# Patient Record
Sex: Male | Born: 1975
Health system: Southern US, Community
[De-identification: ages and names within clinical notes are randomized; demographics above are authoritative.]

## PROBLEM LIST (undated history)

## (undated) DIAGNOSIS — N200 Calculus of kidney: Secondary | ICD-10-CM

## (undated) DIAGNOSIS — I10 Essential (primary) hypertension: Secondary | ICD-10-CM

## (undated) DIAGNOSIS — B351 Tinea unguium: Secondary | ICD-10-CM

## (undated) DIAGNOSIS — F32A Depression, unspecified: Secondary | ICD-10-CM

## (undated) DIAGNOSIS — F329 Major depressive disorder, single episode, unspecified: Secondary | ICD-10-CM

## (undated) HISTORY — PX: LITHOTRIPSY: SUR834

## (undated) HISTORY — PX: HERNIA REPAIR: SHX51

## (undated) HISTORY — DX: Tinea unguium: B35.1

## (undated) HISTORY — DX: Depression, unspecified: F32.A

## (undated) HISTORY — DX: Major depressive disorder, single episode, unspecified: F32.9

## (undated) HISTORY — DX: Essential (primary) hypertension: I10

## (undated) HISTORY — DX: Calculus of kidney: N20.0

---

## 2004-04-13 ENCOUNTER — Emergency Department (HOSPITAL_COMMUNITY): Admission: EM | Admit: 2004-04-13 | Discharge: 2004-04-13 | Payer: Self-pay | Admitting: Family Medicine

## 2005-10-28 ENCOUNTER — Emergency Department (HOSPITAL_COMMUNITY): Admission: EM | Admit: 2005-10-28 | Discharge: 2005-10-28 | Payer: Self-pay | Admitting: Family Medicine

## 2006-10-07 ENCOUNTER — Emergency Department (HOSPITAL_COMMUNITY): Admission: EM | Admit: 2006-10-07 | Discharge: 2006-10-07 | Payer: Self-pay | Admitting: Family Medicine

## 2007-11-23 ENCOUNTER — Ambulatory Visit (HOSPITAL_BASED_OUTPATIENT_CLINIC_OR_DEPARTMENT_OTHER): Admission: RE | Admit: 2007-11-23 | Discharge: 2007-11-23 | Payer: Self-pay | Admitting: Urology

## 2007-11-23 ENCOUNTER — Encounter (INDEPENDENT_AMBULATORY_CARE_PROVIDER_SITE_OTHER): Payer: Self-pay | Admitting: Urology

## 2007-12-18 ENCOUNTER — Emergency Department (HOSPITAL_COMMUNITY): Admission: EM | Admit: 2007-12-18 | Discharge: 2007-12-18 | Payer: Self-pay | Admitting: Family Medicine

## 2008-11-17 ENCOUNTER — Ambulatory Visit: Payer: Self-pay | Admitting: Internal Medicine

## 2008-11-17 DIAGNOSIS — F329 Major depressive disorder, single episode, unspecified: Secondary | ICD-10-CM | POA: Insufficient documentation

## 2008-11-17 DIAGNOSIS — L74519 Primary focal hyperhidrosis, unspecified: Secondary | ICD-10-CM | POA: Insufficient documentation

## 2008-11-17 DIAGNOSIS — K648 Other hemorrhoids: Secondary | ICD-10-CM | POA: Insufficient documentation

## 2008-11-17 DIAGNOSIS — K921 Melena: Secondary | ICD-10-CM | POA: Insufficient documentation

## 2008-11-17 LAB — CONVERTED CEMR LAB
ALT: 28 units/L (ref 0–53)
Albumin: 4.5 g/dL (ref 3.5–5.2)
BUN: 12 mg/dL (ref 6–23)
Basophils Relative: 0 % (ref 0.0–3.0)
Chloride: 104 meq/L (ref 96–112)
Cholesterol: 193 mg/dL (ref 0–200)
Creatinine, Ser: 1.5 mg/dL (ref 0.4–1.5)
Eosinophils Relative: 5 % (ref 0.0–5.0)
Glucose, Bld: 93 mg/dL (ref 70–99)
Hgb A1c MFr Bld: 5.9 % (ref 4.6–6.5)
LDL Cholesterol: 136 mg/dL — ABNORMAL HIGH (ref 0–99)
Lymphocytes Relative: 28 % (ref 12.0–46.0)
Monocytes Relative: 2.9 % — ABNORMAL LOW (ref 3.0–12.0)
Neutrophils Relative %: 64.1 % (ref 43.0–77.0)
RBC: 5.73 M/uL (ref 4.22–5.81)
TSH: 1.66 microintl units/mL (ref 0.35–5.50)
Total Bilirubin: 1.3 mg/dL — ABNORMAL HIGH (ref 0.3–1.2)
Total Protein: 7.5 g/dL (ref 6.0–8.3)
Triglycerides: 71 mg/dL (ref 0.0–149.0)
WBC: 5.9 10*3/uL (ref 4.5–10.5)

## 2008-12-09 ENCOUNTER — Telehealth: Payer: Self-pay | Admitting: Internal Medicine

## 2009-12-29 ENCOUNTER — Emergency Department (HOSPITAL_COMMUNITY): Admission: EM | Admit: 2009-12-29 | Discharge: 2009-12-29 | Payer: Self-pay | Admitting: Emergency Medicine

## 2010-12-07 NOTE — Op Note (Signed)
Seth Gallagher, DULWORTH               ACCOUNT NO.:  [id number]   MEDICAL RECORD NO.:  [id number]          PATIENT TYPE:  AMB   LOCATION:  [facility]                         FACILITY:  [facility]   PHYSICIAN:  Ronald L. Earlene Plater, M.D.  DATE OF BIRTH:  September 05, 1975   DATE OF PROCEDURE:  11/23/2007  DATE OF DISCHARGE:                               OPERATIVE REPORT   PREOPERATIVE DIAGNOSIS:  Probable interstitial cystitis and questionable  dysfunctional voiding.   POSTOPERATIVE DIAGNOSIS:  Probable interstitial cystitis and  questionable dysfunctional voiding.   OPERATIVE PROCEDURE:  Cystourethroscopy, HOD, and bladder biopsy.   SURGEON:  Lucrezia Starch. Earlene Plater, M.D.   ANESTHESIA:  LMA.   BLOOD LOSS:  Negligible.   TUBES:  None.   COMPLICATIONS:  None.   SPECIMEN:  Sent to pathology.   INDICATIONS FOR PROCEDURE:  [name] is a very nice 35 year old white  male who presents with a long history of urgency, frequency, dysuria and  suprapubic pain __________ by voiding.  Office cystoscopy revealed mild  diffuse inflammation.  No other lesions were present.  After  understanding the risks, benefits and alternatives, he elected to  undergo cystourethroscopy, hydraulic bladder distention, and bladder  biopsy for both therapeutic and diagnostic reasons.   PROCEDURE IN DETAIL:  The patient was placed in the supine position.  After proper LMA anesthesia, he was placed in the dorsal lithotomy  position and prepped and draped with Betadine in a sterile fashion.  Cystourethroscopy was performed with a 22.5-French Olympus panendoscope.  Utilizing the 12 and 70-degree lens, the bladder was carefully  inspected.  It did not appear to be obstructed, and no significant  prostatic enlargement was noted.  He underwent hydraulic bladder  distention and had a volume of 600 mL, a pressure of 80 cm of water.  On  release, there were some submucosal glomerulations and a few bleeding  spots, especially posteriorly, and  there were also thickened  trabeculations noted after the distention, consistent with dysfunctional  voiding.  The cold cup biopsy was performed in the posteriorly midline  and submitted to pathology, and the base was cauterized with Bugbee  coagulation cautery.  No other lesions were noted to be present.  The  bladder was drained.  The panendoscope was removed.   The patient was taken to the recovery room stable.      Ronald L. Earlene Plater, M.D.  Electronically Signed     RLD/MEDQ  D:  11/23/2007  T:  11/23/2007  Job:  [id number]

## 2011-02-22 ENCOUNTER — Ambulatory Visit (INDEPENDENT_AMBULATORY_CARE_PROVIDER_SITE_OTHER): Payer: BC Managed Care – PPO | Admitting: Family Medicine

## 2011-02-22 ENCOUNTER — Encounter: Payer: Self-pay | Admitting: Family Medicine

## 2011-02-22 VITALS — BP 130/70 | HR 82 | Temp 99.0°F | Ht 75.0 in | Wt 253.0 lb

## 2011-02-22 DIAGNOSIS — K648 Other hemorrhoids: Secondary | ICD-10-CM

## 2011-02-22 DIAGNOSIS — Z833 Family history of diabetes mellitus: Secondary | ICD-10-CM

## 2011-02-22 DIAGNOSIS — Z23 Encounter for immunization: Secondary | ICD-10-CM

## 2011-02-22 DIAGNOSIS — F329 Major depressive disorder, single episode, unspecified: Secondary | ICD-10-CM

## 2011-02-22 DIAGNOSIS — F3289 Other specified depressive episodes: Secondary | ICD-10-CM

## 2011-02-22 NOTE — Patient Instructions (Signed)
Glad to see you today. I would get a flu shot each fall.   Keep exercising. Call me if the hemorrhoids get worse.  Try to avoid straining on the toilet in the meantime.  Come back for fasting labs.  You can get your results through our phone system.  Follow the instructions on the blue card.

## 2011-02-22 NOTE — Progress Notes (Signed)
New pt to ext care. Requesting records.    H/o blood in stool, with prev hemorrhoids.  Mild intermittent sx.  Asking to have this rechecked.    H/o onychomycosis, per [facility] derm  Had Uro eval, requesting records.  FH HTN/DM.  Return for labs.   Due for tdap.   Feeling well o/w.    H/o depression after his father died, but is doing well now.  PMH and SH reviewed  ROS: See HPI, otherwise noncontributory.  Meds, vitals, and allergies reviewed.   GEN: nad, alert and oriented HEENT: mucous membranes moist NECK: supple w/o LA CV: rrr.  no murmur PULM: ctab, no inc wob ABD: soft, +bs EXT: no edema SKIN: no acute rash Rectal exam w/o gross blood, ext exam wnl except for old skin tag remnant of prev ext hemorrhoid

## 2011-02-23 ENCOUNTER — Encounter: Payer: Self-pay | Admitting: Family Medicine

## 2011-02-23 DIAGNOSIS — Z833 Family history of diabetes mellitus: Secondary | ICD-10-CM | POA: Insufficient documentation

## 2011-02-23 NOTE — Assessment & Plan Note (Signed)
Reassured, d/w pt about about not straining and using stool softener as needed.

## 2011-02-23 NOTE — Assessment & Plan Note (Signed)
H/o, related to father's death.  Doing well.  He is exercising, no using drugs, and his mood is good.

## 2011-02-23 NOTE — Assessment & Plan Note (Signed)
Return for labs.  Continue healthy diet/exercise.

## 2011-03-02 ENCOUNTER — Other Ambulatory Visit (INDEPENDENT_AMBULATORY_CARE_PROVIDER_SITE_OTHER): Payer: BC Managed Care – PPO | Admitting: Family Medicine

## 2011-03-02 DIAGNOSIS — Z833 Family history of diabetes mellitus: Secondary | ICD-10-CM

## 2011-03-02 LAB — BASIC METABOLIC PANEL
BUN: 16 mg/dL (ref 6–23)
CO2: 29 mEq/L (ref 19–32)
Calcium: 9.3 mg/dL (ref 8.4–10.5)
GFR: 81.94 mL/min (ref >60.00)
Glucose, Bld: 89 mg/dL (ref 70–99)

## 2011-03-02 LAB — LIPID PANEL
Cholesterol: 160 mg/dL (ref 0–200)
HDL: 44 mg/dL (ref >39.00)
VLDL: 16.6 mg/dL (ref 0.0–40.0)

## 2011-03-03 ENCOUNTER — Encounter: Payer: Self-pay | Admitting: Family Medicine

## 2011-03-03 DIAGNOSIS — N411 Chronic prostatitis: Secondary | ICD-10-CM | POA: Insufficient documentation

## 2011-05-12 ENCOUNTER — Encounter: Payer: Self-pay | Admitting: Family Medicine

## 2011-11-17 ENCOUNTER — Encounter: Payer: BC Managed Care – PPO | Admitting: Family Medicine

## 2011-11-30 ENCOUNTER — Encounter: Payer: Self-pay | Admitting: Family Medicine

## 2011-11-30 ENCOUNTER — Ambulatory Visit (INDEPENDENT_AMBULATORY_CARE_PROVIDER_SITE_OTHER): Payer: BC Managed Care – PPO | Admitting: Family Medicine

## 2011-11-30 VITALS — BP 132/86 | HR 72 | Temp 98.6°F | Ht 75.0 in | Wt 254.8 lb

## 2011-11-30 DIAGNOSIS — R0789 Other chest pain: Secondary | ICD-10-CM

## 2011-11-30 DIAGNOSIS — Z202 Contact with and (suspected) exposure to infections with a predominantly sexual mode of transmission: Secondary | ICD-10-CM

## 2011-11-30 DIAGNOSIS — Z833 Family history of diabetes mellitus: Secondary | ICD-10-CM

## 2011-11-30 DIAGNOSIS — Z0000 Encounter for general adult medical examination without abnormal findings: Secondary | ICD-10-CM

## 2011-11-30 NOTE — Patient Instructions (Addendum)
I would get a flu shot each fall.   Keep exericising.   Come back for fasting labs.   Take care.

## 2011-11-30 NOTE — Progress Notes (Signed)
CPE- See plan.  Routine anticipatory guidance given to patient.  See health maintenance. Healthy diet, exercising.  Td 2012.  Flu shot discussed.  Due for one this fall.   PSA not indicated now.  D/w pt.  Colon cancer screening not needed.  H/o internal hemorrhoids, not bothersome except for some itching and occ blood with wiping.  Asking for routine STD testing.  No symptoms.   FH DM.   Will return for fasting labs.  No personal hx DM.    Atypical chest pain at rest.  Not with exercise.  L chest pain, lasts ~15 min.  No CP at time of exam.  Can exercise w/o pain.  No CAD hx.    PMH and SH reviewed  Meds, vitals, and allergies reviewed.   ROS: See HPI.  Otherwise negative.    GEN: nad, alert and oriented HEENT: mucous membranes moist NECK: supple w/o LA CV: rrr. PULM: ctab, no inc wob ABD: soft, +bs EXT: no edema SKIN: no acute rash  EKG unremarkable.

## 2011-12-01 DIAGNOSIS — Z0000 Encounter for general adult medical examination without abnormal findings: Secondary | ICD-10-CM | POA: Insufficient documentation

## 2011-12-01 DIAGNOSIS — R0789 Other chest pain: Secondary | ICD-10-CM | POA: Insufficient documentation

## 2011-12-01 NOTE — Assessment & Plan Note (Signed)
Routine anticipatory guidance given to patient. See health maintenance.  Healthy diet, exercising.  Td 2012.  Flu shot discussed. Due for one this fall.  PSA not indicated now. D/w pt.  Colon cancer screening not needed.  Asking for routine STD testing. Return for labs.

## 2011-12-01 NOTE — Assessment & Plan Note (Signed)
EKG, exercise history reassuring.  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  F/u prn.  D/w pt. Reassuring exam.

## 2011-12-01 NOTE — Assessment & Plan Note (Signed)
Return for labs.  Continue healthy diet.

## 2011-12-06 ENCOUNTER — Telehealth: Payer: Self-pay | Admitting: Radiology

## 2011-12-06 ENCOUNTER — Other Ambulatory Visit (INDEPENDENT_AMBULATORY_CARE_PROVIDER_SITE_OTHER): Payer: BC Managed Care – PPO

## 2011-12-06 DIAGNOSIS — Z833 Family history of diabetes mellitus: Secondary | ICD-10-CM

## 2011-12-06 DIAGNOSIS — Z202 Contact with and (suspected) exposure to infections with a predominantly sexual mode of transmission: Secondary | ICD-10-CM

## 2011-12-06 LAB — BASIC METABOLIC PANEL
BUN: 13 mg/dL (ref 6–23)
CO2: 29 mEq/L (ref 19–32)
Chloride: 102 mEq/L (ref 96–112)
Glucose, Bld: 88 mg/dL (ref 70–99)
Potassium: 4 mEq/L (ref 3.5–5.1)
Sodium: 139 mEq/L (ref 135–145)

## 2011-12-06 LAB — LIPID PANEL: Cholesterol: 167 mg/dL (ref 0–200)

## 2011-12-06 NOTE — Telephone Encounter (Signed)
Patients wants any test that you can do for cancer screening, family history of cancer.

## 2011-12-07 LAB — HIV ANTIBODY (ROUTINE TESTING W REFLEX): HIV: NONREACTIVE

## 2011-12-07 LAB — RPR

## 2011-12-07 NOTE — Telephone Encounter (Signed)
Please call pt.  You usually don't start colon cancer screening until 50 or 10 years ahead of family history, which ever is younger.  For example, if he had an uncle/parent/family member with colon cancer at 20, then we'd start at 44.  O/w would start at 50.  Thanks.

## 2011-12-07 NOTE — Telephone Encounter (Signed)
Left message on machine for patient to call back.

## 2011-12-09 NOTE — Telephone Encounter (Signed)
Left detailed message on VM.

## 2011-12-13 ENCOUNTER — Encounter: Payer: Self-pay | Admitting: *Deleted

## 2012-05-18 ENCOUNTER — Encounter: Payer: Self-pay | Admitting: Family Medicine

## 2012-05-18 ENCOUNTER — Ambulatory Visit (INDEPENDENT_AMBULATORY_CARE_PROVIDER_SITE_OTHER): Payer: BC Managed Care – PPO | Admitting: Family Medicine

## 2012-05-18 VITALS — BP 138/86 | HR 94 | Temp 98.2°F | Wt 252.0 lb

## 2012-05-18 DIAGNOSIS — N200 Calculus of kidney: Secondary | ICD-10-CM

## 2012-05-18 LAB — BASIC METABOLIC PANEL
CO2: 27 mEq/L (ref 19–32)
Calcium: 9.3 mg/dL (ref 8.4–10.5)
Creatinine, Ser: 1.6 mg/dL — ABNORMAL HIGH (ref 0.4–1.5)
Glucose, Bld: 113 mg/dL — ABNORMAL HIGH (ref 70–99)

## 2012-05-18 NOTE — Patient Instructions (Addendum)
We'll contact you with your lab report. Take the pain medicine as needed, finish the antibiotics and pull the stent tomorrow.  If you have concerns, call [phone] and tell them you had seen Dr. Brunilda Payor.   Take care.

## 2012-05-19 ENCOUNTER — Emergency Department (HOSPITAL_COMMUNITY)
Admission: EM | Admit: 2012-05-19 | Discharge: 2012-05-20 | Disposition: A | Discharge status: Home / Self Care | Payer: BC Managed Care – PPO | Attending: Emergency Medicine | Admitting: Emergency Medicine

## 2012-05-19 ENCOUNTER — Emergency Department (HOSPITAL_COMMUNITY): Payer: BC Managed Care – PPO

## 2012-05-19 ENCOUNTER — Encounter (HOSPITAL_COMMUNITY): Payer: Self-pay | Admitting: *Deleted

## 2012-05-19 DIAGNOSIS — F329 Major depressive disorder, single episode, unspecified: Secondary | ICD-10-CM | POA: Insufficient documentation

## 2012-05-19 DIAGNOSIS — N289 Disorder of kidney and ureter, unspecified: Secondary | ICD-10-CM

## 2012-05-19 DIAGNOSIS — N368 Other specified disorders of urethra: Secondary | ICD-10-CM | POA: Insufficient documentation

## 2012-05-19 DIAGNOSIS — F3289 Other specified depressive episodes: Secondary | ICD-10-CM | POA: Insufficient documentation

## 2012-05-19 DIAGNOSIS — N23 Unspecified renal colic: Secondary | ICD-10-CM

## 2012-05-19 DIAGNOSIS — Z9889 Other specified postprocedural states: Secondary | ICD-10-CM | POA: Insufficient documentation

## 2012-05-19 LAB — CBC WITH DIFFERENTIAL/PLATELET
Basophils Absolute: 0 K/uL (ref 0.0–0.1)
Basophils Relative: 0 % (ref 0–1)
Eosinophils Absolute: 0.3 10*3/uL (ref 0.0–0.7)
Eosinophils Relative: 5 % (ref 0–5)
HCT: 47.1 % (ref 39.0–52.0)
Hemoglobin: 15.5 g/dL (ref 13.0–17.0)
Lymphocytes Relative: 36 % (ref 12–46)
Lymphs Abs: 2.4 K/uL (ref 0.7–4.0)
MCH: 27.9 pg (ref 26.0–34.0)
MCHC: 32.9 g/dL (ref 30.0–36.0)
MCV: 84.9 fL (ref 78.0–100.0)
Monocytes Absolute: 0.4 K/uL (ref 0.1–1.0)
Monocytes Relative: 6 % (ref 3–12)
Neutro Abs: 3.5 K/uL (ref 1.7–7.7)
Neutrophils Relative %: 53 % (ref 43–77)
Platelets: 193 10*3/uL (ref 150–400)
RBC: 5.55 MIL/uL (ref 4.22–5.81)
RDW: 13 % (ref 11.5–15.5)
WBC: 6.6 K/uL (ref 4.0–10.5)

## 2012-05-19 LAB — COMPREHENSIVE METABOLIC PANEL WITH GFR
ALT: 16 U/L (ref 0–53)
AST: 20 U/L (ref 0–37)
CO2: 26 meq/L (ref 19–32)
Chloride: 99 meq/L (ref 96–112)
Creatinine, Ser: 1.68 mg/dL — ABNORMAL HIGH (ref 0.50–1.35)
GFR calc non Af Amer: 51 mL/min — ABNORMAL LOW (ref >90)
Glucose, Bld: 118 mg/dL — ABNORMAL HIGH (ref 70–99)
Sodium: 138 meq/L (ref 135–145)
Total Bilirubin: 0.4 mg/dL (ref 0.3–1.2)

## 2012-05-19 LAB — COMPREHENSIVE METABOLIC PANEL
Albumin: 4 g/dL (ref 3.5–5.2)
Alkaline Phosphatase: 79 U/L (ref 39–117)
BUN: 16 mg/dL (ref 6–23)
Calcium: 9.5 mg/dL (ref 8.4–10.5)
GFR calc Af Amer: 59 mL/min — ABNORMAL LOW (ref >90)
Potassium: 3.3 mEq/L — ABNORMAL LOW (ref 3.5–5.1)
Total Protein: 7.4 g/dL (ref 6.0–8.3)

## 2012-05-19 MED ORDER — FENTANYL CITRATE 0.05 MG/ML IJ SOLN
50.0000 ug | Freq: Once | INTRAMUSCULAR | Status: AC
  Administered 2012-05-19: 50 ug via INTRAVENOUS
  Filled 2012-05-19: qty 2

## 2012-05-19 MED ORDER — FENTANYL CITRATE 0.05 MG/ML IJ SOLN
50.0000 ug | Freq: Once | INTRAMUSCULAR | Status: AC
  Administered 2012-05-20: 50 ug via INTRAVENOUS
  Filled 2012-05-19: qty 2

## 2012-05-19 MED ORDER — ONDANSETRON HCL 4 MG/2ML IJ SOLN
4.0000 mg | Freq: Once | INTRAMUSCULAR | Status: AC
  Administered 2012-05-19: 4 mg via INTRAVENOUS
  Filled 2012-05-19: qty 2

## 2012-05-19 NOTE — ED Provider Notes (Signed)
History     CSN: [id number]  Arrival date & time 05/19/12  2212   First MD Initiated Contact with Patient 05/19/12 2315      Chief Complaint  Patient presents with  . Abdominal Pain    (Consider location/radiation/quality/duration/timing/severity/associated sxs/prior treatment) HPI Comments: Patient reports one week ago while in [place] he suffered from a kidney stone attack. He was seen at a local hospital in [place] and had "laser therapy" to break up the stone and had a stent placed into his right ureter to pass the stones. His pill bottles suggest that he is currently on Levaquin, Percocet, Pyridium for symptoms. He has since returned home and was told that he could remove the stent himself today. He reports he did some research on line and also met with his primary care physician, Dr. Para March on Friday who informed him that he also could remove the stent himself, just to take some pain medication prior to doing so. He reports he removed the stent after taking 2 Percocet tablets at approximately 1:00 PM. He reports he is able to successfully remove the stent, however approximately 20 or 30 minutes afterwards had severe pain on the right side, very similar to the pain from his kidney stone but without any nausea or vomiting. Patient came here to the emergency department due to the severe pain. Prior to my evaluation, patient was given 50 mcg of IV fentanyl as well as Zofran and the patient currently reports his pain is gone down from a 10 out of 10 to about a 3/10. He denies any fever or chills. He has not noticed any blood in his urine currently. He reports that he is otherwise healthy, did have some renal insufficiency due to the kidney stone. He reports that he did have blood drawn by Dr. Para March and that his creatinine seems to be improving.  Pain was in right flank radiating towards groin.    Patient is a 36 y.o. male presenting with abdominal pain. The history is provided by the patient and  a relative.  Abdominal Pain The primary symptoms of the illness include abdominal pain. The primary symptoms of the illness do not include fever, nausea, vomiting or diarrhea.  Symptoms associated with the illness do not include chills, constipation, frequency or back pain.    Past Medical History  Diagnosis Date  . Onychomycosis   . Depression     h/o after father's death  . Hemorrhoids   . Renal disorder     kidney stones    Past Surgical History  Procedure Date  . Hernia repair   . Lithotripsy     Family History  Problem Relation Age of Onset  . Diabetes Mother   . Hypertension Mother   . Cancer Father     ocular cancer, died of lung CA  . Hypertension Father   . Depression Brother   . Hypertension Brother   . Prostate cancer Neg Hx   . Colon cancer Neg Hx     History  Substance Use Topics  . Smoking status: Never Smoker   . Smokeless tobacco: Never Used  . Alcohol Use: Yes     very rare      Review of Systems  Constitutional: Negative for fever and chills.  Gastrointestinal: Positive for abdominal pain. Negative for nausea, vomiting, diarrhea and constipation.  Genitourinary: Positive for flank pain. Negative for frequency and testicular pain.  Musculoskeletal: Negative for back pain.  All other systems reviewed and are negative.  Allergies  Review of patient's allergies indicates no known allergies.  Home Medications   Current Outpatient Rx  Name Route Sig Dispense Refill  . ONE-DAILY MULTI VITAMINS PO TABS Oral Take 1 tablet by mouth daily.    . OXYCODONE-ACETAMINOPHEN 5-325 MG PO TABS Oral Take 1 tablet by mouth every 6 (six) hours as needed.    Marland Kitchen PHENAZOPYRIDINE HCL 200 MG PO TABS Oral Take 200 mg by mouth 3 (three) times daily as needed.      BP 133/71  Pulse 83  Temp 98.3 F (36.8 C) (Oral)  Resp 22  Ht 6\' 4"  (1.93 m)  Wt 250 lb (113.399 kg)  BMI 30.43 kg/m2  SpO2 95%  Physical Exam  Nursing note and vitals  reviewed. Constitutional: He is oriented to person, place, and time. He appears well-developed and well-nourished.  HENT:  Head: Normocephalic and atraumatic.  Eyes: EOM are normal. Pupils are equal, round, and reactive to light.  Neck: Normal range of motion. Neck supple.  Cardiovascular: Normal rate and regular rhythm.   Pulmonary/Chest: Effort normal and breath sounds normal.  Abdominal: Soft. He exhibits no distension. There is no tenderness.  Neurological: He is alert and oriented to person, place, and time.  Skin: Skin is warm.  Psychiatric: He has a normal mood and affect.    ED Course  Procedures (including critical care time)  Labs Reviewed  COMPREHENSIVE METABOLIC PANEL - Abnormal; Notable for the following:    Potassium 3.3 (*)     Glucose, Bld 118 (*)     Creatinine, Ser 1.68 (*)     GFR calc non Af Amer 51 (*)     GFR calc Af Amer 59 (*)     All other components within normal limits  URINALYSIS, ROUTINE W REFLEX MICROSCOPIC - Abnormal; Notable for the following:    Color, Urine ORANGE (*)  BIOCHEMICALS MAY BE AFFECTED BY COLOR   APPearance CLOUDY (*)     Hgb urine dipstick LARGE (*)     Protein, ur 100 (*)     Nitrite POSITIVE (*)     Leukocytes, UA SMALL (*)     All other components within normal limits  URINE MICROSCOPIC-ADD ON - Abnormal; Notable for the following:    Bacteria, UA FEW (*)     All other components within normal limits  CBC WITH DIFFERENTIAL  URINE CULTURE   Dg Abd Portable 1v  05/20/2012  *RADIOLOGY REPORT*  Clinical Data: Right upper quadrant abdominal pain.  The patient remained renal catheter today per doctors instruction.  Nausea and dark urine.  ABDOMEN - 1 VIEW  Comparison: CT pelvis 12/01/2009  Findings: Normal bowel gas pattern with scattered gas and stool in the colon.  No small or large bowel distension.  No radiopaque stones are visualized.  Mild degenerative change in the right hip.  IMPRESSION: Nonobstructive bowel gas pattern.  No  radiopaque stones visualized.   Original Report Authenticated By: Marlon Pel, M.D.      1. Ureteral colic   2. Renal insufficiency     ra sat is 95% and normal by my interpretation.   1:59 AM Pt reports no pain at this time, has had no recurrences since last dose of fentanyl which is likely now worn off.  Ok to go home, will follow up with PCP this week, has analgesics at home arleady. UA showing blood likely due to taking stent out.  Pt is already on levaquin, ok to continue, culture sent.  MDM  Pt likely with spasm after removal of stent on his own.  Will send urine and culture, will check KUB to assess for retained stent piece.  Pt brought with him and appears to be intact.  Pain is improved already.  Cr is still elevated, will avoid NSAIDs for now.          Gavin Pound. Oletta Lamas, MD 05/20/12 1610

## 2012-05-19 NOTE — ED Notes (Signed)
Pt had lithotripsy with stent placement  On Monday. Pt removed stent himself, per md, and immediately pain increased. Paint to R abd and pt states it feels just like he's having another kidney stone.  Pt took a percocet around 1900 and is presently writhing in pain.

## 2012-05-20 ENCOUNTER — Encounter: Payer: Self-pay | Admitting: Family Medicine

## 2012-05-20 DIAGNOSIS — N200 Calculus of kidney: Secondary | ICD-10-CM | POA: Insufficient documentation

## 2012-05-20 LAB — URINALYSIS, ROUTINE W REFLEX MICROSCOPIC
Bilirubin Urine: NEGATIVE
Glucose, UA: NEGATIVE mg/dL
Ketones, ur: NEGATIVE mg/dL
Nitrite: POSITIVE — AB
Protein, ur: 100 mg/dL — AB
Specific Gravity, Urine: 1.022 (ref 1.005–1.030)
Urobilinogen, UA: 0.2 mg/dL (ref 0.0–1.0)
pH: 6 (ref 5.0–8.0)

## 2012-05-20 LAB — URINE MICROSCOPIC-ADD ON

## 2012-05-20 NOTE — Progress Notes (Signed)
Healthy 36 y/o male with sudden onset R abd/groin pain 6 days ago. Was seen for renal stone, admitted and GA and treated with stent and levaquin/oxycodone.  Needs f/u renal panel.  Still with stent intact.  Had seen Dr. Brunilda Payor with uro prev but not recently or for this.  Was told to remove the stent tomorrow and cut back on protein intake.  Urine is orange from meds.  Pain improved.  No vomiting, no fever.    Meds, vitals, and allergies reviewed.   ROS: See HPI.  Otherwise, noncontributory.  nad ncat Mmm rrr ctab Abd soft, no rebound No CVA pain Groin with normal exam except for string from stent per urethral meatus.   bmet with mild inc in cr but he has sig muscle mass noted on exam

## 2012-05-20 NOTE — Assessment & Plan Note (Signed)
Recheck bmet today, will repeat in 1 week.  He'll pull stent tomorrow.  He agrees with plan.  I talked with Dr. Margarita Grizzle about this plan.  Pt can f/u with uro locally if needed.  He had pain meds and abx to use in the meantime.

## 2012-05-21 LAB — URINE CULTURE
Colony Count: NO GROWTH
Culture: NO GROWTH

## 2012-05-25 ENCOUNTER — Other Ambulatory Visit (INDEPENDENT_AMBULATORY_CARE_PROVIDER_SITE_OTHER): Payer: BC Managed Care – PPO

## 2012-05-25 DIAGNOSIS — N200 Calculus of kidney: Secondary | ICD-10-CM

## 2012-05-25 LAB — BASIC METABOLIC PANEL
BUN: 16 mg/dL (ref 6–23)
CO2: 25 mEq/L (ref 19–32)
Calcium: 9 mg/dL (ref 8.4–10.5)
Creatinine, Ser: 1.3 mg/dL (ref 0.4–1.5)
Glucose, Bld: 116 mg/dL — ABNORMAL HIGH (ref 70–99)

## 2012-05-31 ENCOUNTER — Encounter: Payer: Self-pay | Admitting: *Deleted

## 2012-11-20 ENCOUNTER — Ambulatory Visit (INDEPENDENT_AMBULATORY_CARE_PROVIDER_SITE_OTHER): Payer: BC Managed Care – PPO | Admitting: Family Medicine

## 2012-11-20 ENCOUNTER — Encounter: Payer: Self-pay | Admitting: Family Medicine

## 2012-11-20 VITALS — BP 124/84 | HR 83 | Temp 98.5°F | Wt 248.8 lb

## 2012-11-20 DIAGNOSIS — Z0000 Encounter for general adult medical examination without abnormal findings: Secondary | ICD-10-CM

## 2012-11-20 DIAGNOSIS — Z3182 Encounter for Rh incompatibility status: Secondary | ICD-10-CM

## 2012-11-20 NOTE — Patient Instructions (Addendum)
Congrats.  I would talk to the [facility] clinic about the Rhogam concerns.  It would be standard to use for a mother who is rh neg.  Go to the lab on the way out.  We'll contact you with your lab report.

## 2012-11-20 NOTE — Progress Notes (Signed)
He has a son on the way.  Her GF is Rh neg and he wanted to check his blood type.  She is due 8/14.  She'll likely deliver at [facility]. We discussed.    Meds, vitals, and allergies reviewed.   ROS: See HPI.  Otherwise, noncontributory.  GEN: nad, alert and oriented HEENT: mucous membranes moist NECK: supple w/o LA CV: rrr.  no murmur PULM: ctab, no inc wob ABD: soft, +bs EXT: no edema SKIN: no acute rash

## 2012-11-21 DIAGNOSIS — Z3182 Encounter for Rh incompatibility status: Secondary | ICD-10-CM | POA: Insufficient documentation

## 2012-11-21 NOTE — Assessment & Plan Note (Signed)
D/w pt that Rhogam was the typical treatment in cases of a Rh neg mother.  We talked about path/phys.  See notes on labs.  He can discuss his labs with this girlfriend and take the labs to the [facility] clinic.  He understood.

## 2012-11-23 ENCOUNTER — Telehealth: Payer: Self-pay

## 2012-11-23 NOTE — Telephone Encounter (Signed)
Pt had been given O positive as results and pt wanted to know about the [facility] compatibility; advised pt per result note; pt will contact OB clinic.

## 2013-09-15 ENCOUNTER — Emergency Department (HOSPITAL_COMMUNITY)
Admission: EM | Admit: 2013-09-15 | Discharge: 2013-09-15 | Disposition: A | Discharge status: Home / Self Care | Payer: BC Managed Care – PPO | Source: Home / Self Care | Attending: Family Medicine | Admitting: Family Medicine

## 2013-09-15 ENCOUNTER — Encounter (HOSPITAL_COMMUNITY): Payer: Self-pay | Admitting: Emergency Medicine

## 2013-09-15 DIAGNOSIS — K529 Noninfective gastroenteritis and colitis, unspecified: Secondary | ICD-10-CM

## 2013-09-15 DIAGNOSIS — K5289 Other specified noninfective gastroenteritis and colitis: Secondary | ICD-10-CM

## 2013-09-15 DIAGNOSIS — R197 Diarrhea, unspecified: Secondary | ICD-10-CM

## 2013-09-15 NOTE — ED Notes (Addendum)
Pt  REPORTS  DIARRHEA   SINCE  YEST   UNRELEIVED  BY   OTC  Immodium     Ambulated  To  Room  With a  Steady  Fluid  Gait     Has  Been taking  Fluids     Nausea       abd     Cramping

## 2013-09-15 NOTE — ED Provider Notes (Signed)
CSN: [id number]     Arrival date & time 09/15/13  1456 History   First MD Initiated Contact with Patient 09/15/13 1552     Chief Complaint  Patient presents with  . Diarrhea     (Consider location/radiation/quality/duration/timing/severity/associated sxs/prior Treatment) HPI Comments: Patient reports a <24 hour history of diarrhea and cramping. No bloody stools.  He is some better today. Slight fever and chills. No N,V. Decreased appetite. No known exposures.   Patient is a 38 y.o. male presenting with diarrhea. The history is provided by the patient.  Diarrhea   Past Medical History  Diagnosis Date  . Onychomycosis   . Depression     h/o after father's death  . Hemorrhoids   . Renal stones     2013   Past Surgical History  Procedure Laterality Date  . Hernia repair    . Lithotripsy     Family History  Problem Relation Age of Onset  . Diabetes Mother   . Hypertension Mother   . Cancer Father     ocular cancer, died of lung CA  . Hypertension Father   . Depression Brother   . Hypertension Brother   . Prostate cancer Neg Hx   . Colon cancer Neg Hx    History  Substance Use Topics  . Smoking status: Never Smoker   . Smokeless tobacco: Never Used  . Alcohol Use: Yes     Comment: very rare    Review of Systems  Gastrointestinal: Positive for diarrhea.  All other systems reviewed and are negative.      Allergies  Review of patient's allergies indicates no known allergies.  Home Medications   Current Outpatient Rx  Name  Route  Sig  Dispense  Refill  . Multiple Vitamin (MULTIVITAMIN) tablet   Oral   Take 1 tablet by mouth daily.          BP 145/89  Pulse 92  Temp(Src) 100.5 F (38.1 C) (Oral)  Resp 19  SpO2 96% Physical Exam  Nursing note and vitals reviewed. Constitutional: He is oriented to person, place, and time. He appears well-developed and well-nourished. No distress.  HENT:  Head: Normocephalic.  Pulmonary/Chest: Effort normal.   Abdominal: Soft. Bowel sounds are normal. He exhibits no distension. There is tenderness. There is no rebound and no guarding.  Neurological: He is alert and oriented to person, place, and time.  Skin: Skin is warm and dry. He is not diaphoretic.  Psychiatric: His behavior is normal.    ED Course  Procedures (including critical care time) Labs Review Labs Reviewed - No data to display Imaging Review No results found.    MDM   Final diagnoses:  Gastroenteritis  Diarrhea  ** No indication for an antibiotic given duration. Treat symptomatically. Avoid Anti-diarrhea type medications. Hydrate. Reassurance given. F/U if Fever or diarrhea persist longer than a week.    Riki SheerMichelle G Juanita Devincent, PA-C 09/15/13 1622

## 2013-09-15 NOTE — Discharge Instructions (Signed)
Diarrhea Diarrhea is watery poop (stool). It can make you feel weak, tired, thirsty, or give you a dry mouth (signs of dehydration). Watery poop is a sign of another problem, most often an infection. It often lasts 2 3 days. It can last longer if it is a sign of something serious. Take care of yourself as told by your doctor. HOME CARE   Drink 1 cup (8 ounces) of fluid each time you have watery poop.  Do not drink the following fluids:  Those that contain simple sugars (fructose, glucose, galactose, lactose, sucrose, maltose).  Sports drinks.  Fruit juices.  Whole milk products.  Sodas.  Drinks with caffeine (coffee, tea, soda) or alcohol.  Oral rehydration solution may be used if the doctor says it is okay. You may make your own solution. Follow this recipe:    teaspoon table salt.   teaspoon baking soda.   teaspoon salt substitute containing potassium chloride.  1 tablespoons sugar.  1 liter (34 ounces) of water.  Avoid the following foods:  High fiber foods, such as raw fruits and vegetables.  Nuts, seeds, and whole grain breads and cereals.   Those that are sweetened with sugar alcohols (xylitol, sorbitol, mannitol).  Try eating the following foods:  Starchy foods, such as rice, toast, pasta, low-sugar cereal, oatmeal, baked potatoes, crackers, and bagels.  Bananas.  Applesauce.  Eat probiotic-rich foods, such as yogurt and milk products that are fermented.  Wash your hands well after each time you have watery poop.  Only take medicine as told by your doctor.  Take a warm bath to help lessen burning or pain from having watery poop. GET HELP RIGHT AWAY IF:   You cannot drink fluids without throwing up (vomiting).  You keep throwing up.  You have blood in your poop, or your poop looks black and tarry.  You do not pee (urinate) in 6 8 hours, or there is only a small amount of very dark pee.  You have belly (abdominal) pain that gets worse or stays in  the same spot (localizes).  You are weak, dizzy, confused, or lightheaded.  You have a very bad headache.  Your watery poop gets worse or does not get better.  You have a fever or lasting symptoms for more than 2 3 days.  You have a fever and your symptoms suddenly get worse. MAKE SURE YOU:   Understand these instructions.  Will watch your condition.  Will get help right away if you are not doing well or get worse. Document Released: 12/28/2007 Document Revised: 04/04/2012 Document Reviewed: 03/18/2012 [facility] Patient Information 2014 [employer], [place].   Hopefully symptoms will continue to improve. No indication for an antibiotic at this time.Avoid anti-diarrhea medications as they may extend the virus. Stay hydrated and avoid foods high in fat for the new few days.  Hang in there !

## 2013-09-17 NOTE — ED Provider Notes (Signed)
Medical screening examination/treatment/procedure(s) were performed by a resident physician or non-physician practitioner and as the supervising physician I was immediately available for consultation/collaboration.  Joelee Snoke, MD    Quasim Doyon S Romonda Parker, MD 09/17/13 0729 

## 2014-08-23 ENCOUNTER — Emergency Department (INDEPENDENT_AMBULATORY_CARE_PROVIDER_SITE_OTHER): Payer: BLUE CROSS/BLUE SHIELD

## 2014-08-23 ENCOUNTER — Encounter (HOSPITAL_COMMUNITY): Payer: Self-pay | Admitting: *Deleted

## 2014-08-23 ENCOUNTER — Emergency Department (INDEPENDENT_AMBULATORY_CARE_PROVIDER_SITE_OTHER)
Admission: EM | Admit: 2014-08-23 | Discharge: 2014-08-23 | Disposition: A | Discharge status: Home / Self Care | Payer: BLUE CROSS/BLUE SHIELD | Source: Home / Self Care

## 2014-08-23 DIAGNOSIS — J019 Acute sinusitis, unspecified: Secondary | ICD-10-CM

## 2014-08-23 DIAGNOSIS — J309 Allergic rhinitis, unspecified: Secondary | ICD-10-CM

## 2014-08-23 DIAGNOSIS — R059 Cough, unspecified: Secondary | ICD-10-CM

## 2014-08-23 DIAGNOSIS — R05 Cough: Secondary | ICD-10-CM

## 2014-08-23 MED ORDER — METHYLPREDNISOLONE ACETATE 40 MG/ML IJ SUSP
INTRAMUSCULAR | Status: AC
  Filled 2014-08-23: qty 1

## 2014-08-23 MED ORDER — FLUTICASONE PROPIONATE 50 MCG/ACT NA SUSP: 2.0000 | Freq: Every day | NASAL | Status: DC

## 2014-08-23 MED ORDER — METHYLPREDNISOLONE ACETATE 80 MG/ML IJ SUSP
120.0000 mg | Freq: Once | INTRAMUSCULAR | Status: AC
  Administered 2014-08-23: 120 mg via INTRAMUSCULAR

## 2014-08-23 MED ORDER — METHYLPREDNISOLONE ACETATE 80 MG/ML IJ SUSP
INTRAMUSCULAR | Status: AC
  Filled 2014-08-23: qty 1

## 2014-08-23 MED ORDER — PREDNISONE 20 MG PO TABS: ORAL_TABLET | ORAL | Status: DC

## 2014-08-23 MED ORDER — CEFDINIR 300 MG PO CAPS: 300.0000 mg | ORAL_CAPSULE | Freq: Two times a day (BID) | ORAL | Status: DC

## 2014-08-23 MED ORDER — FEXOFENADINE-PSEUDOEPHED ER 60-120 MG PO TB12: 1.0000 | ORAL_TABLET | Freq: Two times a day (BID) | ORAL | Status: DC

## 2014-08-23 NOTE — Discharge Instructions (Signed)
People who suffer from allergies frequently have symptoms of nasal congestion, runny nose, sneezing, itching of the nose, eyes, ears or throat, mucous in the throat, watering of the eyes and cough.  These symptoms are caused by the body's immune response to environmental allergens.  For seasonal allergies this is pollen (tree pollen in the spring, grass pollen in the summer, and weed pollen in the fall).  Year round allergy symptoms are usually caused by dust or mould.  Many people have year round symptoms which are worse seasonally. ° °For people who have seasonal allergies, pollen avoidance may help to decease symptoms.  This means keeping windows in the house down and windows in the car up.  Run your air conditioning, since this filters out many of the pollen particles.  If you have to spend a prolonged time outdoors during heavy pollen season, it might be prudent to wear a mask.  These can be purchased at any drug store.  When you come in after heavy pollen exposure, your skin, clothing and hair are covered with pollen.  Changing your clothing, taking a shower, and washing your hair may help with your pollen exposure.  Also, your bedding, pillow, and pillowcase may become contaminated with pollen, so frequent washing of your bedding and pillowcase and changing out your pillow may help as well.  (Your pillow can also be a source of dust and mould exposure as well.)  Showering at bedtime may also help. ° °During heavy pollen season (April and September), a large amount of pollen gets trapped in your nasal cavity.  This can contribute to ongoing allergy symptoms.  Saline irrigation of the nasal cavity can help to remove this and relieve allergy symptoms.  This can be accomplished in several ways.  You can mix up your own saline solution using the following recipe:  8 oz of distilled or boiled water, 1/2 tsp of table salt (sodium choride), and a pinch of baking soda (sodium bicarbonate).  If nasal congestion is a  problem.  1 to 2 drops of Afrin solution can be added to this as well.  To do the irrigation, purchase a nasal bulb syringe (the kind you would use to clean out an infant's nose).  Fill this up with the solution, lean you head over a sink with the nostril to be irrigated turned upward, insert the syringe into your nostril, making a tight seal, and gently irrigate, compressing the bulb.  The solution will flow into your nostril and out the other, some may also come out of your mouth.  Repeat this on both sides.  You can do this once daily.  Do not store the solution, mix it up fresh each day.  A commercial solution, called Neomed Solution, can be purchased over the counter without prescription.  You can also use a Netti Pot for irrigation.  These can be purchased at your drug store as well.  Be sure to use distilled or boiled water in these as well and make sure the Netti pot is completely dry between uses. ° °Over the counter medications can be helpful, and in many cases can completely control allergy symptoms without resorting to more expensive prescription meds.   °Antihistamines are the mainstay of allergy treatment.  The newer non-sedating antihistamines are all available over the counter.  These include Allegra, Zyrtec, and Claritin which also can be purchased in their generic forms: fexofenadine, cetirizine, and  Cetirizine.  Combining these meds with a decongestant such as pseudoephedrine or   phenylephrine helps with nasal congestion, but decongestants can also cause elevations in blood pressure.  Pseudoephedrine tends to be more effective than phenylephrine.  The older, more sedating antihistamines such as chlorpheniramine, brompheniramine, and diphenhydramine are also very effective, sometimes more so than the newer antihistamines, but with the price of more sedation.  You should be careful about driving or operating heavy machinery when taking sedating antihistamines, and men with enlarged prostates may  experience urinary retention with diphenhydramine. ° °Naslacrom nasal spray can be very effective for allergy symptoms.  It is available over the counter and has very few side effects.  The dosage is 2 sprays in each nostril twice daily.  It is recommended that you pinch your nose shut for 30 seconds after using it since it is a watery spray and can run out.  It can be used as long as needed.  There is no risk of dependency. ° °For people with year round allergies, dust, mould, insect emanations, and pet dander are usually the culprits. ° °To avoid dust, you need to avoid dust mites which are the main source of allergens in house dust.  Cover your bedding with moisture and mite impervious covers.  These can be purchased at any mattress store.  The modern covers are a little expensive, but not at all uncomfortable. Keeping your house as dry as possible will also help to control dust mites.  Do not use a humidifier and it may help to use a dehumidifier.  Use of a HEPA filter air filter is also a great way to reduce dust and mold exposure.  These units can be purchased commercially.  Make sure to buy one large enough for the room you intend to use it.  Change the filter as per the manufacturer's instructions.  Also, using a HEPA filter vacuum for your carpets is helpful.  There are chemicals that you can sprinkle on your carpet called acaricides that will kill dist mites.  The most commonly used brand is Acarosan.  This can be purchased on line.  It does have to be periodically reapplied.  Wash you pillows and bedsheets regularly in hot water. ° ° ° ° ° ° ° ° ° °

## 2014-08-23 NOTE — ED Notes (Signed)
Pt  Reports  Has  Had      Cough    / congested         Symptoms   Of             Had        Took    A   z   Pack           Symptoms  Got  Better  And           Then  Came       Back

## 2014-08-23 NOTE — ED Provider Notes (Signed)
Chief Complaint   URI   History of Present Illness   Arrow Jaquez Farrington is a 39 year old male who has had a one-month history of cough and congestion. He went to an urgent care about 2 weeks ago and was diagnosed with pneumonia on the basis of a physical examination. He did not have a chest x-ray. He was given a Z-Pak. This seemed to help for a while but then the symptoms came back. His cough is productive of small amounts of yellow sputum with some tightness and wheezing on occasion. He denies any chest pain. He's had nasal congestion with clear to yellow drainage and sinus pressure. He denies any sneezing, itching of the nose, or itchy watery eyes. He's had no sore throat, postnasal drip, or hoarseness. No fever or chills. He does have a history of allergies. No history of asthma.  Review of Systems   Other than as noted above, the patient denies any of the following symptoms: Systemic:  No fevers, chills, sweats, or myalgias. Eye:  No redness or discharge. ENT:  No ear pain, headache, nasal congestion, drainage, sinus pressure, or sore throat. Neck:  No neck pain, stiffness, or swollen glands. Lungs:  No cough, sputum production, hemoptysis, wheezing, chest tightness, shortness of breath or chest pain. GI:  No abdominal pain, nausea, vomiting or diarrhea.  PMFSH   Past medical history, family history, social history, meds, and allergies were reviewed.   Physical exam   Vital signs:  BP 160/87 mmHg  Pulse 83  Temp(Src) 98.2 F (36.8 C) (Oral)  Resp 16  SpO2 95% General:  Alert and oriented.  In no distress.  Skin warm and dry. Eye:  No conjunctival injection or drainage. Lids were normal. ENT:  TMs and canals were normal, without erythema or inflammation.  Nasal mucosa was markedly congested, pale, and boggy without any drainage.  Mucous membranes were moist.  Was erythematous with yellowish white postnasal drainage.  There were no oral ulcerations or lesions. Neck:  Supple,  no adenopathy, tenderness or mass. Lungs:  No respiratory distress.  Lungs were clear to auscultation, without wheezes, rales or rhonchi.  Breath sounds were clear and equal bilaterally.  Heart:  Regular rhythm, without gallops, murmers or rubs. Skin:  Clear, warm, and dry, without rash or lesions.  Radiology   Dg Chest 2 View  08/23/2014   CLINICAL DATA:  Intermittent cough for 1 month  EXAM: CHEST  2 VIEW  COMPARISON:  None.  FINDINGS: The heart size and mediastinal contours are within normal limits. Both lungs are clear. The visualized skeletal structures are unremarkable.  IMPRESSION: No active cardiopulmonary disease.   Electronically Signed   By: Ellery Plunk M.D.   On: 08/23/2014 17:54     Course in [facility]   The following medications were given:  Medications  methylPREDNISolone acetate (DEPO-MEDROL) injection 120 mg (120 mg Intramuscular Given 08/23/14 1851)   Assessment     The primary encounter diagnosis was Allergic rhinitis, unspecified allergic rhinitis type. Diagnoses of Cough and Acute sinusitis, recurrence not specified, unspecified location were also pertinent to this visit.  There is no evidence of pneumonia at this time. I think his present cough is due to postnasal drainage, secondary to allergic rhinitis and sinusitis. In addition to the above treatment, I have recommended that he follow-up with an allergist.  Plan    1.  Meds:  The following meds were prescribed:   Discharge Medication List as of 08/23/2014  6:37 PM  START taking these medications   Details  cefdinir (OMNICEF) 300 MG capsule Take 1 capsule (300 mg total) by mouth 2 (two) times daily., Starting 08/23/2014, Until Discontinued, Normal    fexofenadine-pseudoephedrine (ALLEGRA-D) 60-120 MG per tablet Take 1 tablet by mouth every 12 (twelve) hours., Starting 08/23/2014, Until Discontinued, Normal    fluticasone (FLONASE) 50 MCG/ACT nasal spray Place 2 sprays into both nostrils daily.,  Starting 08/23/2014, Until Discontinued, Normal    predniSONE (DELTASONE) 20 MG tablet Take 3 daily for 5 days, 2 daily for 5 days, 1 daily for 5 days., Normal        2.  Patient Education/Counseling:  The patient was given appropriate handouts, self care instructions, and instructed in symptomatic relief.  Instructed to get extra fluids and extra rest.  Given instructions in allergen avoidance.  3.  Follow up:  The patient was told to follow up here if no better in 3 to 4 days, or sooner if becoming worse in any way, and given some red flag symptoms such as increasing fever, difficulty breathing, chest pain, or persistent vomiting which would prompt immediate return.  Follow-up with Dr. Patrice Paradiseajan Sharma.      Reuben Likesavid C Koralynn Greenspan, MD 08/23/14 [phone]

## 2014-11-18 ENCOUNTER — Ambulatory Visit (INDEPENDENT_AMBULATORY_CARE_PROVIDER_SITE_OTHER): Payer: BLUE CROSS/BLUE SHIELD | Admitting: Family Medicine

## 2014-11-18 ENCOUNTER — Encounter: Payer: Self-pay | Admitting: Family Medicine

## 2014-11-18 ENCOUNTER — Encounter: Payer: Self-pay | Admitting: Gastroenterology

## 2014-11-18 VITALS — BP 136/102 | HR 74 | Temp 98.5°F | Ht 76.0 in | Wt 260.5 lb

## 2014-11-18 DIAGNOSIS — I10 Essential (primary) hypertension: Secondary | ICD-10-CM

## 2014-11-18 DIAGNOSIS — Z7189 Other specified counseling: Secondary | ICD-10-CM

## 2014-11-18 DIAGNOSIS — Z113 Encounter for screening for infections with a predominantly sexual mode of transmission: Secondary | ICD-10-CM | POA: Diagnosis not present

## 2014-11-18 DIAGNOSIS — Z0000 Encounter for general adult medical examination without abnormal findings: Secondary | ICD-10-CM | POA: Diagnosis not present

## 2014-11-18 DIAGNOSIS — K648 Other hemorrhoids: Secondary | ICD-10-CM

## 2014-11-18 LAB — LIPID PANEL
CHOL/HDL RATIO: 4
Cholesterol: 148 mg/dL (ref 0–200)
HDL: 35.9 mg/dL — ABNORMAL LOW (ref >39.00)
LDL Cholesterol: 98 mg/dL (ref 0–99)
NONHDL: 112.1
Triglycerides: 71 mg/dL (ref 0.0–149.0)
VLDL: 14.2 mg/dL (ref 0.0–40.0)

## 2014-11-18 LAB — COMPREHENSIVE METABOLIC PANEL
ALBUMIN: 4.3 g/dL (ref 3.5–5.2)
ALT: 16 U/L (ref 0–53)
AST: 14 U/L (ref 0–37)
Alkaline Phosphatase: 77 U/L (ref 39–117)
BILIRUBIN TOTAL: 0.4 mg/dL (ref 0.2–1.2)
BUN: 16 mg/dL (ref 6–23)
CHLORIDE: 104 meq/L (ref 96–112)
CO2: 30 mEq/L (ref 19–32)
CREATININE: 1.35 mg/dL (ref 0.40–1.50)
Calcium: 9.5 mg/dL (ref 8.4–10.5)
GFR: 75.53 mL/min (ref >60.00)
GLUCOSE: 106 mg/dL — AB (ref 70–99)
Potassium: 4 mEq/L (ref 3.5–5.1)
SODIUM: 138 meq/L (ref 135–145)
TOTAL PROTEIN: 6.7 g/dL (ref 6.0–8.3)

## 2014-11-18 MED ORDER — LOSARTAN POTASSIUM 50 MG PO TABS: 25.0000 mg | ORAL_TABLET | Freq: Every day | ORAL | Status: AC | PRN

## 2014-11-18 NOTE — Progress Notes (Signed)
Pre visit review using our clinic review tool, if applicable. No additional management support is needed unless otherwise documented below in the visit note.  CPE- See plan.  Routine anticipatory guidance given to patient.  See health maintenance. Tetanus 2012 Flu encouraged.   PNA and shingles not due, d/w pt Colon and prostate cancer screening not due, d/w pt Living will d/w pt.  Mother designated if patient were incapacitated, then his brother second in line.   Diet and exercise d/w pt.   Due for labs.   He wanted STD testing.  No symptoms.   Continues to have bleeding from int hemorrhoids, on pain.  Asking about referral to GI.    Xmas 2015- had a [facility], got some better but didn't resolve.  Had to be seen at different UC again, CXR wnl.   Cough is resolved.   BP elevated noted in meantime, started on losartan in meantime, taking med 2-3 times in a week.  He had some days with normal BP.   Diet is variable, esp with travel for work. He's going to get some help with cooking ahead of time.   No CP, SOB, BLE edema.   PMH and SH reviewed  Meds, vitals, and allergies reviewed.   ROS: See HPI.  Otherwise negative.    GEN: nad, alert and oriented HEENT: mucous membranes moist NECK: supple w/o LA CV: rrr. PULM: ctab, no inc wob ABD: soft, +bs, small soft umbilical hernia noted.  D/w pt about referral to surgery if needed, later on.   EXT: no edema SKIN: no acute rash

## 2014-11-18 NOTE — Patient Instructions (Addendum)
Go to the lab on the way out.  We'll contact you with your lab report. Seth Gallagher will call about your referral. Keep exercising, try to cut back on salt and take 25mg  losartan a day.  If your BP stays >140/>90, then go up to 50mg  a day. Update me with your BP in about 1-2 weeks.  Take care.  Glad to see you.

## 2014-11-19 DIAGNOSIS — K648 Other hemorrhoids: Secondary | ICD-10-CM | POA: Insufficient documentation

## 2014-11-19 DIAGNOSIS — I10 Essential (primary) hypertension: Secondary | ICD-10-CM | POA: Insufficient documentation

## 2014-11-19 DIAGNOSIS — Z7189 Other specified counseling: Secondary | ICD-10-CM | POA: Insufficient documentation

## 2014-11-19 LAB — GC/CHLAMYDIA PROBE AMP, URINE
CHLAMYDIA, SWAB/URINE, PCR: NEGATIVE
GC Probe Amp, Urine: NEGATIVE

## 2014-11-19 LAB — RPR

## 2014-11-19 LAB — HIV ANTIBODY (ROUTINE TESTING W REFLEX): HIV 1&2 Ab, 4th Generation: NONREACTIVE

## 2014-11-19 NOTE — Assessment & Plan Note (Signed)
Normal external exam, refer to GI for possible banding.  He agrees.

## 2014-11-19 NOTE — Assessment & Plan Note (Signed)
See notes on labs.  Start 25mg  a day losartan and he'll update me with BP readings.  Continue work on diet/execise.  He agrees.

## 2014-11-19 NOTE — Assessment & Plan Note (Signed)
Routine anticipatory guidance given to patient.  See health maintenance. Tetanus 2012 Flu encouraged.   PNA and shingles not due, d/w pt Colon and prostate cancer screening not due, d/w pt Living will d/w pt.  Mother designated if patient were incapacitated, then his brother second in line.   Diet and exercise d/w pt.   Due for labs.   He wanted STD testing.  No symptoms.  See notes on labs.

## 2014-12-08 ENCOUNTER — Ambulatory Visit (INDEPENDENT_AMBULATORY_CARE_PROVIDER_SITE_OTHER): Payer: BLUE CROSS/BLUE SHIELD | Admitting: Gastroenterology

## 2014-12-08 ENCOUNTER — Encounter: Payer: Self-pay | Admitting: Gastroenterology

## 2014-12-08 VITALS — BP 136/84 | HR 72 | Ht 74.75 in | Wt 262.1 lb

## 2014-12-08 DIAGNOSIS — K648 Other hemorrhoids: Secondary | ICD-10-CM

## 2014-12-08 NOTE — Progress Notes (Signed)
_                                                                                                                History of Present Illness:  Seth Gallagher is a 39 year old African American male referred at the request of Dr. Para Marchuncan for evaluation of hemorrhoids.  For the past 3 years he's been bothered by hemorrhoids characterized by prolapse of hemorrhoids following a bowel movement that often requires manual reduction.  He often has small amounts of blood when he cleans himself.  He moves his bowels regularly.  He is a weight lifter.   Past Medical History  Diagnosis Date  . Onychomycosis   . Depression     h/o after father's death  . Hemorrhoids   . Renal stones     2013  . Hypertension    Past Surgical History  Procedure Laterality Date  . Hernia repair    . Lithotripsy     family history includes Cancer in his father; Colon polyps in his paternal uncle; Crohn's disease in his paternal uncle; Depression in his brother; Diabetes in his mother; Hypertension in his brother, father, and mother. There is no history of Prostate cancer, Colon cancer, Esophageal cancer, or Gallbladder disease. Current Outpatient Prescriptions  Medication Sig Dispense Refill  . fexofenadine-pseudoephedrine (ALLEGRA-D) 60-120 MG per tablet Take 1 tablet by mouth every 12 (twelve) hours. (Patient taking differently: Take 1 tablet by mouth as needed. ) 30 tablet 0  . fluticasone (FLONASE) 50 MCG/ACT nasal spray Place 2 sprays into both nostrils as needed.     Marland Kitchen. losartan (COZAAR) 50 MG tablet Take 0.5-1 tablets (25-50 mg total) by mouth daily as needed. 90 tablet 3  . Multiple Vitamin (MULTIVITAMIN) tablet Take 1 tablet by mouth daily.     No current facility-administered medications for this visit.   Allergies as of 12/08/2014  . (No Known Allergies)    reports that he has never smoked. He has never used smokeless tobacco. He reports that he drinks alcohol. He reports that he does not use  illicit drugs.   Review of Systems: Pertinent positive and negative review of systems were noted in the above HPI section. All other review of systems were otherwise negative.  Vital signs were reviewed in today's medical record Physical Exam: General: Well developed , well nourished, no acute distress Skin: anicteric Head: Normocephalic and atraumatic Eyes:  sclerae anicteric, EOMI Ears: Normal auditory acuity Mouth: No deformity or lesions Neck: Supple, no masses or thyromegaly Lymph Nodes: no lymphadenopathy Lungs: Clear throughout to auscultation Heart: Regular rate and rhythm; no murmurs, rubs or bruits Gastroinestinal: Soft, non tender and non distended. No masses, hepatosplenomegaly or hernias noted. Normal Bowel sounds Rectal: No external abnormalities Musculoskeletal: Symmetrical with no gross deformities  Skin: No lesions on visible extremities Pulses:  Normal pulses noted Extremities: No clubbing, cyanosis, edema or deformities noted Neurological: Alert oriented x 4, grossly nonfocal Cervical Nodes:  No significant cervical adenopathy Inguinal Nodes: No significant inguinal adenopathy Psychological:  Alert and cooperative. Normal mood and affect  Endoscopy demonstrated internal hemorrhoids, 3 columns  PROCEDURE NOTE: The patient presents with symptomatic grade *2**  hemorrhoids, requesting rubber band ligation of his/her hemorrhoidal disease.  All risks, benefits and alternative forms of therapy were described and informed consent was obtained.   The anorectum was pre-medicated with lubricant and nitroglycerine ointment The decision was made to band the *right posterior** internal hemorrhoid, and the [facility] O'Regan System was used to perform band ligation without complication.  Digital anorectal examination was then performed to assure proper positioning of the band, and to adjust the banded tissue as required.  The patient was discharged home without pain or other  issues.  Dietary and behavioral recommendations were given and along with follow-up instructions.    The patient will return in *2** weeks for  follow-up and possible additional banding as required. No complications were encountered and the patient tolerated the procedure well.    See Assessment and Plan under Problem List

## 2014-12-08 NOTE — Patient Instructions (Addendum)
HEMORRHOID BANDING PROCEDURE    FOLLOW-UP CARE   1. The procedure you have had should have been relatively painless since the banding of the area involved does not have nerve endings and there is no pain sensation.  The rubber band cuts off the blood supply to the hemorrhoid and the band may fall off as soon as 48 hours after the banding (the band may occasionally be seen in the toilet bowl following a bowel movement). You may notice a temporary feeling of fullness in the rectum which should respond adequately to plain Tylenol or Motrin.  2. Following the banding, avoid strenuous exercise that evening and resume full activity the next day.  A sitz bath (soaking in a warm tub) or bidet is soothing, and can be useful for cleansing the area after bowel movements.     3. To avoid constipation, take two tablespoons of natural wheat bran, natural oat bran, flax, Benefiber or any over the counter fiber supplement and increase your water intake to 7-8 glasses daily.    4. Unless you have been prescribed anorectal medication, do not put anything inside your rectum for two weeks: No suppositories, enemas, fingers, etc.  5. Occasionally, you may have more bleeding than usual after the banding procedure.  This is often from the untreated hemorrhoids rather than the treated one.  Don't be concerned if there is a tablespoon or so of blood.  If there is more blood than this, lie flat with your bottom higher than your head and apply an ice pack to the area. If the bleeding does not stop within a half an hour or if you feel faint, call our office at [phone] or go to the emergency room.  6. Problems are not common; however, if there is a substantial amount of bleeding, severe pain, chills, fever or difficulty passing urine (very rare) or other problems, you should call us at [phone] or report to the nearest emergency room.  7. Do not stay seated continuously for more than 2-3 hours for a day or two  after the procedure.  Tighten your buttock muscles 10-15 times every two hours and take 10-15 deep breaths every 1-2 hours.  Do not spend more than a few minutes on the toilet if you cannot empty your bowel; instead re-visit the toilet at a later time.   Your 2nd banding is scheduled on Monday  02/16/2015 at 10:45am

## 2014-12-08 NOTE — Assessment & Plan Note (Signed)
Symptomatic hemorrhoids.  I did mention to the patient that Valsalva associated with weight lifting could exacerbate hemorrhoids.

## 2015-02-16 ENCOUNTER — Encounter: Payer: Self-pay | Admitting: Gastroenterology

## 2015-02-16 ENCOUNTER — Ambulatory Visit (INDEPENDENT_AMBULATORY_CARE_PROVIDER_SITE_OTHER): Payer: BLUE CROSS/BLUE SHIELD | Admitting: Gastroenterology

## 2015-02-16 VITALS — BP 150/100 | HR 70 | Ht 74.75 in | Wt 261.1 lb

## 2015-02-16 DIAGNOSIS — K648 Other hemorrhoids: Secondary | ICD-10-CM

## 2015-02-16 NOTE — Progress Notes (Signed)
PROCEDURE NOTE: The patient presents with symptomatic grade *2**  hemorrhoids, requesting rubber band ligation of his/her hemorrhoidal disease.  All risks, benefits and alternative forms of therapy were described and informed consent was obtained.   The anorectum was pre-medicated with lubricant and nitroglycerine ointment The decision was made to band the right*anterior** internal hemorrhoid, and the [facility] O'Regan System was used to perform band ligation without complication.  Digital anorectal examination was then performed to assure proper positioning of the band, and to adjust the banded tissue as required.  The patient was discharged home without pain or other issues.  Dietary and behavioral recommendations were given and along with follow-up instructions.    The patient will return in *4** weeks for  follow-up and possible additional banding as required. No complications were encountered and the patient tolerated the procedure well.   

## 2015-02-16 NOTE — Patient Instructions (Addendum)
HEMORRHOID BANDING PROCEDURE    FOLLOW-UP CARE   1. The procedure you have had should have been relatively painless since the banding of the area involved does not have nerve endings and there is no pain sensation.  The rubber band cuts off the blood supply to the hemorrhoid and the band may fall off as soon as 48 hours after the banding (the band may occasionally be seen in the toilet bowl following a bowel movement). You may notice a temporary feeling of fullness in the rectum which should respond adequately to plain Tylenol or Motrin.  2. Following the banding, avoid strenuous exercise that evening and resume full activity the next day.  A sitz bath (soaking in a warm tub) or bidet is soothing, and can be useful for cleansing the area after bowel movements.     3. To avoid constipation, take two tablespoons of natural wheat bran, natural oat bran, flax, Benefiber or any over the counter fiber supplement and increase your water intake to 7-8 glasses daily.    4. Unless you have been prescribed anorectal medication, do not put anything inside your rectum for two weeks: No suppositories, enemas, fingers, etc.  5. Occasionally, you may have more bleeding than usual after the banding procedure.  This is often from the untreated hemorrhoids rather than the treated one.  Don't be concerned if there is a tablespoon or so of blood.  If there is more blood than this, lie flat with your bottom higher than your head and apply an ice pack to the area. If the bleeding does not stop within a half an hour or if you feel faint, call our office at [phone] or go to the emergency room.  6. Problems are not common; however, if there is a substantial amount of bleeding, severe pain, chills, fever or difficulty passing urine (very rare) or other problems, you should call us at [phone] or report to the nearest emergency room.  7. Do not stay seated continuously for more than 2-3 hours for a day or two  after the procedure.  Tighten your buttock muscles 10-15 times every two hours and take 10-15 deep breaths every 1-2 hours.  Do not spend more than a few minutes on the toilet if you cannot empty your bowel; instead re-visit the toilet at a later time.    Your 3rd banding is scheduled on 04/15/2015 at 10:15am

## 2015-04-15 ENCOUNTER — Ambulatory Visit (INDEPENDENT_AMBULATORY_CARE_PROVIDER_SITE_OTHER): Payer: BLUE CROSS/BLUE SHIELD | Admitting: Gastroenterology

## 2015-04-15 ENCOUNTER — Encounter: Payer: Self-pay | Admitting: Gastroenterology

## 2015-04-15 VITALS — BP 120/80 | HR 80 | Ht 74.75 in | Wt 264.8 lb

## 2015-04-15 DIAGNOSIS — K648 Other hemorrhoids: Secondary | ICD-10-CM | POA: Diagnosis not present

## 2015-04-15 NOTE — Patient Instructions (Signed)
HEMORRHOID BANDING PROCEDURE    FOLLOW-UP CARE   1. The procedure you have had should have been relatively painless since the banding of the area involved does not have nerve endings and there is no pain sensation.  The rubber band cuts off the blood supply to the hemorrhoid and the band may fall off as soon as 48 hours after the banding (the band may occasionally be seen in the toilet bowl following a bowel movement). You may notice a temporary feeling of fullness in the rectum which should respond adequately to plain Tylenol or Motrin.  2. Following the banding, avoid strenuous exercise that evening and resume full activity the next day.  A sitz bath (soaking in a warm tub) or bidet is soothing, and can be useful for cleansing the area after bowel movements.     3. To avoid constipation, take two tablespoons of natural wheat bran, natural oat bran, flax, Benefiber or any over the counter fiber supplement and increase your water intake to 7-8 glasses daily.    4. Unless you have been prescribed anorectal medication, do not put anything inside your rectum for two weeks: No suppositories, enemas, fingers, etc.  5. Occasionally, you may have more bleeding than usual after the banding procedure.  This is often from the untreated hemorrhoids rather than the treated one.  Don't be concerned if there is a tablespoon or so of blood.  If there is more blood than this, lie flat with your bottom higher than your head and apply an ice pack to the area. If the bleeding does not stop within a half an hour or if you feel faint, call our office at [phone] or go to the emergency room.  6. Problems are not common; however, if there is a substantial amount of bleeding, severe pain, chills, fever or difficulty passing urine (very rare) or other problems, you should call us at [phone] or report to the nearest emergency room.  7. Do not stay seated continuously for more than 2-3 hours for a day or two  after the procedure.  Tighten your buttock muscles 10-15 times every two hours and take 10-15 deep breaths every 1-2 hours.  Do not spend more than a few minutes on the toilet if you cannot empty your bowel; instead re-visit the toilet at a later time.   Your follow up appointment is scheduled on 06/15/2015 at 3pm

## 2015-04-15 NOTE — Progress Notes (Signed)
PROCEDURE NOTE: The patient presents with symptomatic grade *2**  hemorrhoids, requesting rubber band ligation of his/her hemorrhoidal disease.  All risks, benefits and alternative forms of therapy were described and informed consent was obtained.   The anorectum was pre-medicated with lubricant and nitroglycerine ointment The decision was made to band the *left anterior** internal hemorrhoid, and the [facility] O'Regan System was used to perform band ligation without complication.  Digital anorectal examination was then performed to assure proper positioning of the band, and to adjust the banded tissue as required.  The patient was discharged home without pain or other issues.  Dietary and behavioral recommendations were given and along with follow-up instructions.    The patient will return in *4** weeks for  follow-up and possible additional banding as required. No complications were encountered and the patient tolerated the procedure well.   

## 2015-05-13 ENCOUNTER — Encounter: Payer: Self-pay | Admitting: Gastroenterology

## 2015-05-15 ENCOUNTER — Telehealth: Payer: Self-pay | Admitting: Gastroenterology

## 2015-05-15 NOTE — Telephone Encounter (Signed)
He may schedule in any hemorrhoid banding appt slot on either MD schedule next available , not a work in

## 2015-05-22 IMAGING — DX DG CHEST 2V
2 series · 2 of 2 positions shown · non-contrast
Comparison: None.

CLINICAL DATA: Intermittent cough for 1 month

EXAM:
CHEST  2 VIEW

[chest pa]
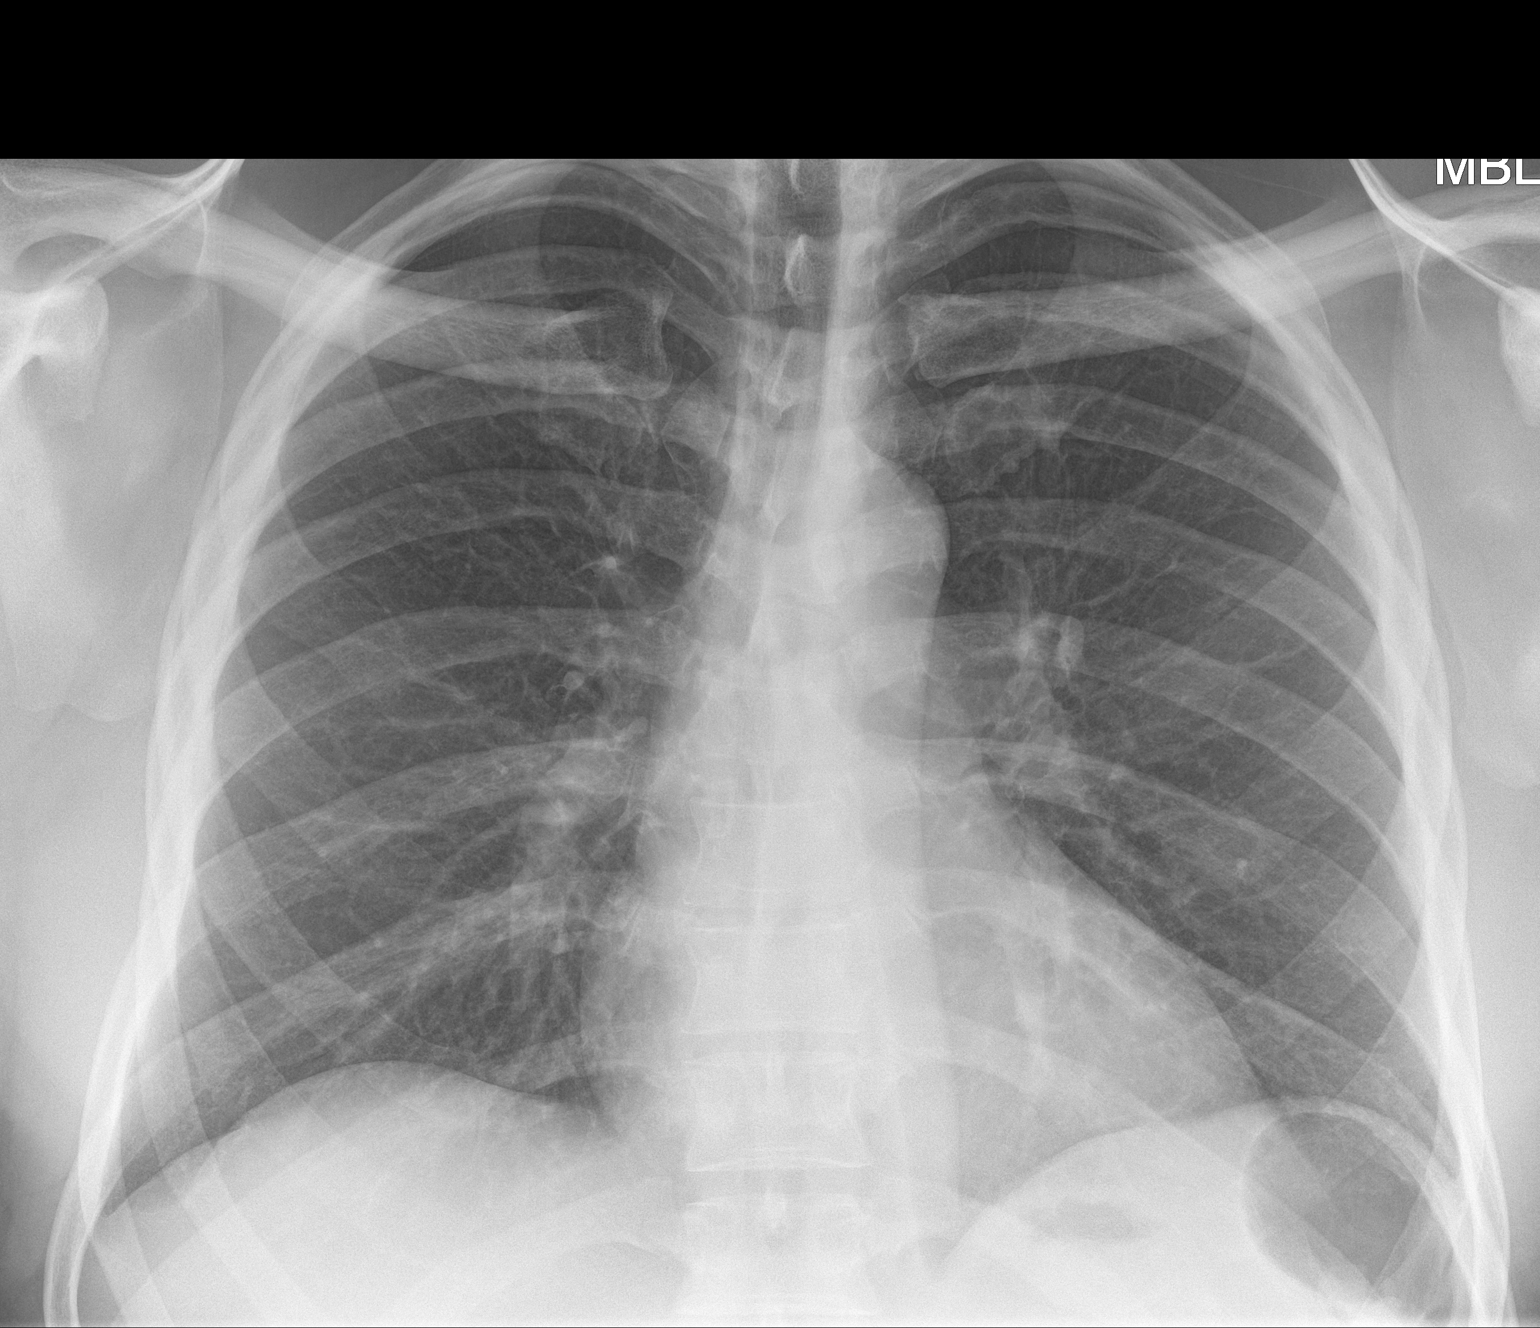

[chest lat]
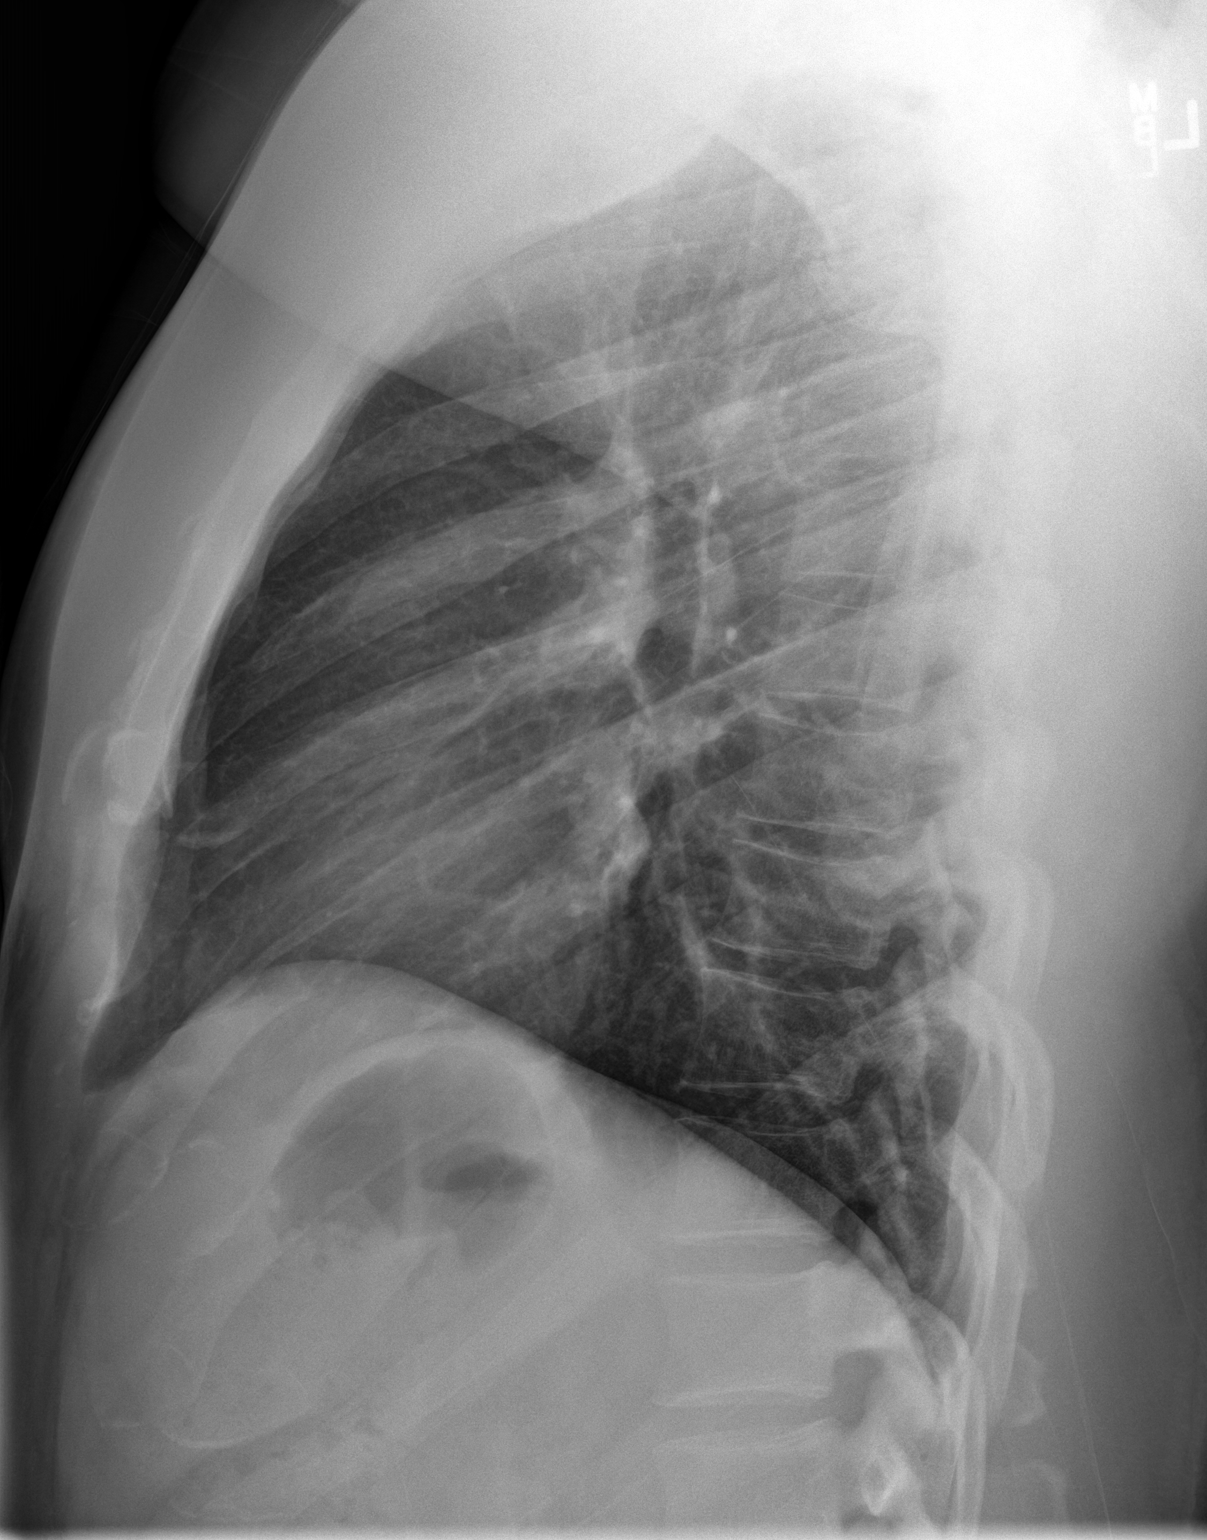

[2 of 2 positions shown; findings below may reference images not displayed]

FINDINGS: The heart size and mediastinal contours are within normal limits.
Both lungs are clear. The visualized skeletal structures are
unremarkable.
IMPRESSION: No active cardiopulmonary disease.

## 2015-06-15 ENCOUNTER — Ambulatory Visit: Payer: BLUE CROSS/BLUE SHIELD | Admitting: Gastroenterology

## 2015-09-17 ENCOUNTER — Ambulatory Visit (INDEPENDENT_AMBULATORY_CARE_PROVIDER_SITE_OTHER): Payer: BLUE CROSS/BLUE SHIELD | Admitting: Internal Medicine

## 2015-09-17 ENCOUNTER — Ambulatory Visit (INDEPENDENT_AMBULATORY_CARE_PROVIDER_SITE_OTHER): Payer: BLUE CROSS/BLUE SHIELD | Admitting: Gastroenterology

## 2015-09-17 ENCOUNTER — Encounter: Payer: Self-pay | Admitting: Gastroenterology

## 2015-09-17 VITALS — BP 128/74 | HR 74 | Ht 74.75 in | Wt 257.0 lb

## 2015-09-17 DIAGNOSIS — K625 Hemorrhage of anus and rectum: Secondary | ICD-10-CM | POA: Diagnosis not present

## 2015-09-17 DIAGNOSIS — K640 First degree hemorrhoids: Secondary | ICD-10-CM

## 2015-09-17 DIAGNOSIS — K648 Other hemorrhoids: Secondary | ICD-10-CM | POA: Diagnosis not present

## 2015-09-17 NOTE — Progress Notes (Signed)
[facility] Progress Note  Chief Complaint: rectal bleeding  Subjective History:  Had HR banding with Arlyce Dice x 2 last yr, most recently 9/16. About a month later, began rebleeding and having protruding HR after BM. No dyschezia, BMs regular, denies abd pain  ROS: Cardiovascular:  no chest pain Respiratory: no dyspnea  Past Medical History: Past Medical History  Diagnosis Date  . Onychomycosis   . Depression     h/o after father's death  . Hemorrhoids   . Renal stones     2013  . Hypertension     Objective:  Med list reviewed  Vital signs in last 24 hrs: Filed Vitals:   09/17/15 0915  BP: 128/74  Pulse: 74    Physical Exam    Cardiac: RRR without murmurs, S1S2 heard, no peripheral edema  Pulm: clear to auscultation bilaterally, normal RR and effort noted  Abdomen: soft, no tenderness, with active bowel sounds. No guarding or palpable hepatosplenomegaly  See Dr Marvell Fuller report from today Patient had left sided int HR, banded successfully  @ Assessment: Encounter Diagnoses  Name Primary?  . Rectal bleeding Yes  . First degree hemorrhoids      S/p banding Plan: Post banding instructions given. F/u prn   Charlie Pitter III

## 2015-09-17 NOTE — Progress Notes (Signed)
   C/o prolapsed left lateral hemorrhoid with bleeding - s/p banding of all 3 columns in 2016 PROCEDURE NOTE: The patient presents with symptomatic grade 2- 3 hemorrhoids, requesting rubber band ligation of his/her hemorrhoidal disease.  All risks, benefits and alternative forms of therapy were described and informed consent was obtained.  In the Left Lateral Decubitus position anoscopic examination revealed grade 2 hemorrhoid in the LL position and grade 1 RA and RP.  The anorectum was pre-medicated with 0.125% NTG and 5% lidocaine The decision was made to band the LL internal hemorrhoid, and the [facility] O'Regan System was used to perform band ligation without complication.  Digital anorectal examination was then performed to assure proper positioning of the band, and to adjust the banded tissue as required.  The patient was discharged home without pain or other issues.  Dietary and behavioral recommendations were given and along with follow-up instructions.       The patient will return prn for  follow-up and possible additional banding as required. No complications were encountered and the patient tolerated the procedure well.

## 2015-09-17 NOTE — Patient Instructions (Signed)
HEMORRHOID BANDING PROCEDURE    FOLLOW-UP CARE   1. The procedure you have had should have been relatively painless since the banding of the area involved does not have nerve endings and there is no pain sensation.  The rubber band cuts off the blood supply to the hemorrhoid and the band may fall off as soon as 48 hours after the banding (the band may occasionally be seen in the toilet bowl following a bowel movement). You may notice a temporary feeling of fullness in the rectum which should respond adequately to plain Tylenol or Motrin.  2. Following the banding, avoid strenuous exercise that evening and resume full activity the next day.  A sitz bath (soaking in a warm tub) or bidet is soothing, and can be useful for cleansing the area after bowel movements.     3. To avoid constipation, take two tablespoons of natural wheat bran, natural oat bran, flax, Benefiber or any over the counter fiber supplement and increase your water intake to 7-8 glasses daily.    4. Unless you have been prescribed anorectal medication, do not put anything inside your rectum for two weeks: No suppositories, enemas, fingers, etc.  5. Occasionally, you may have more bleeding than usual after the banding procedure.  This is often from the untreated hemorrhoids rather than the treated one.  Don't be concerned if there is a tablespoon or so of blood.  If there is more blood than this, lie flat with your bottom higher than your head and apply an ice pack to the area. If the bleeding does not stop within a half an hour or if you feel faint, call our office at [phone] or go to the emergency room.  6. Problems are not common; however, if there is a substantial amount of bleeding, severe pain, chills, fever or difficulty passing urine (very rare) or other problems, you should call us at [phone] or report to the nearest emergency room.  7. Do not stay seated continuously for more than 2-3 hours for a day or two  after the procedure.  Tighten your buttock muscles 10-15 times every two hours and take 10-15 deep breaths every 1-2 hours.  Do not spend more than a few minutes on the toilet if you cannot empty your bowel; instead re-visit the toilet at a later time.   Follow up as needed.  Thank you for choosing [facility]  Dr Amada Jupiter III

## 2015-09-18 NOTE — Assessment & Plan Note (Signed)
LL banded F/u PRN

## 2017-06-18 ENCOUNTER — Other Ambulatory Visit: Payer: Self-pay | Admitting: Family Medicine

## 2017-06-18 DIAGNOSIS — I10 Essential (primary) hypertension: Secondary | ICD-10-CM

## 2017-06-19 ENCOUNTER — Other Ambulatory Visit (INDEPENDENT_AMBULATORY_CARE_PROVIDER_SITE_OTHER): Payer: BLUE CROSS/BLUE SHIELD

## 2017-06-19 DIAGNOSIS — Z113 Encounter for screening for infections with a predominantly sexual mode of transmission: Secondary | ICD-10-CM

## 2017-06-19 DIAGNOSIS — I10 Essential (primary) hypertension: Secondary | ICD-10-CM | POA: Diagnosis not present

## 2017-06-19 LAB — COMPREHENSIVE METABOLIC PANEL
ALT: 14 U/L (ref 0–53)
AST: 14 U/L (ref 0–37)
Albumin: 4.6 g/dL (ref 3.5–5.2)
Alkaline Phosphatase: 73 U/L (ref 39–117)
BILIRUBIN TOTAL: 0.8 mg/dL (ref 0.2–1.2)
BUN: 12 mg/dL (ref 6–23)
CALCIUM: 9.8 mg/dL (ref 8.4–10.5)
CHLORIDE: 105 meq/L (ref 96–112)
CO2: 28 mEq/L (ref 19–32)
CREATININE: 1.22 mg/dL (ref 0.40–1.50)
GFR: 83.8 mL/min (ref >60.00)
GLUCOSE: 110 mg/dL — AB (ref 70–99)
POTASSIUM: 3.9 meq/L (ref 3.5–5.1)
Sodium: 141 mEq/L (ref 135–145)
Total Protein: 6.9 g/dL (ref 6.0–8.3)

## 2017-06-19 LAB — LIPID PANEL
CHOL/HDL RATIO: 4
Cholesterol: 158 mg/dL (ref 0–200)
HDL: 36.3 mg/dL — AB (ref >39.00)
LDL CALC: 113 mg/dL — AB (ref 0–99)
NONHDL: 122.15
TRIGLYCERIDES: 46 mg/dL (ref 0.0–149.0)
VLDL: 9.2 mg/dL (ref 0.0–40.0)

## 2017-06-20 LAB — RPR: RPR Ser Ql: NONREACTIVE

## 2017-06-20 LAB — C. TRACHOMATIS/N. GONORRHOEAE RNA
C. trachomatis RNA, TMA: NOT DETECTED
N. GONORRHOEAE RNA, TMA: NOT DETECTED

## 2017-06-20 LAB — HIV ANTIBODY (ROUTINE TESTING W REFLEX): HIV 1&2 Ab, 4th Generation: NONREACTIVE

## 2017-06-23 ENCOUNTER — Encounter: Payer: Self-pay | Admitting: Family Medicine

## 2017-06-23 ENCOUNTER — Ambulatory Visit (INDEPENDENT_AMBULATORY_CARE_PROVIDER_SITE_OTHER): Payer: BLUE CROSS/BLUE SHIELD | Admitting: Family Medicine

## 2017-06-23 ENCOUNTER — Encounter (INDEPENDENT_AMBULATORY_CARE_PROVIDER_SITE_OTHER): Payer: Self-pay

## 2017-06-23 VITALS — BP 156/96 | HR 85 | Temp 98.9°F | Wt 258.0 lb

## 2017-06-23 DIAGNOSIS — Z0000 Encounter for general adult medical examination without abnormal findings: Secondary | ICD-10-CM

## 2017-06-23 DIAGNOSIS — Z7189 Other specified counseling: Secondary | ICD-10-CM

## 2017-06-23 DIAGNOSIS — I10 Essential (primary) hypertension: Secondary | ICD-10-CM

## 2017-06-23 NOTE — Patient Instructions (Addendum)
If your BP is consistently >140/>90, then restart 1/2 tab of losartan.   Update me as needed.  I would get a flu shot each fall.   Take care.  Glad to see you.  If the trouble sleeping gets worse then update me.  Thanks for your effort.

## 2017-06-23 NOTE — Progress Notes (Signed)
CPE- See plan.  Routine anticipatory guidance given to patient.  See health maintenance.  The possibility exists that previously documented standard health maintenance information may have been brought forward from a previous encounter into this note.  If needed, that same information has been updated to reflect the current situation based on today's encounter.    Tetanus 2012 Flu encouraged PNA and shingles not due, d/w pt Colon and prostate cancer screening not due, d/w pt Living will d/w pt.  Mother designated if patient were incapacitated, then his brother second in line.   Diet and exercise d/w pt. He is still working hard with exercise but his diet is affected by travel and work.  He cut back on meat in the meantime.  Labs d/w pt. Mild inc in sugar.   Hypertension:    Using medication without problems or lightheadedness: rare use of ARB Chest pain with exertion:no Edema:no Short of breath:no Average home BPs: has been variable, d/w pt.  130s/80s on home checks.   Had been mainly off BP med in the meantime.    He tried melatonin for insomnia.  That helped better than CBD oil.  No SI/HI.  He has a lot of work related stressors.  He had to work to get partial custody of his 424.5 y/o son.    PMH and SH reviewed  Meds, vitals, and allergies reviewed.   ROS: Per HPI.  Unless specifically indicated otherwise in HPI, the patient denies:  General: fever. Eyes: acute vision changes ENT: sore throat Cardiovascular: chest pain Respiratory: SOB GI: vomiting GU: dysuria Musculoskeletal: acute back pain Derm: acute rash Neuro: acute motor dysfunction Psych: worsening mood Endocrine: polydipsia Heme: bleeding Allergy: hayfever  GEN: nad, alert and oriented HEENT: mucous membranes moist NECK: supple w/o LA CV: rrr. PULM: ctab, no inc wob ABD: soft, +bs EXT: no edema SKIN: no acute rash

## 2017-06-25 NOTE — Assessment & Plan Note (Addendum)
Tetanus 2012 Flu encouraged PNA and shingles not due, d/w pt Colon and prostate cancer screening not due, d/w pt Living will d/w pt.  Mother designated if patient were incapacitated, then his brother second in line.   Diet and exercise d/w pt. He is still working hard with exercise but his diet is affected by travel and work.  He cut back on meat in the meantime.  Labs d/w pt. Mild inc in sugar.  Sleep hygiene d/w pt, he'll update me as needed.

## 2017-06-25 NOTE — Assessment & Plan Note (Signed)
Can treat if consistently elevated.  See AVS.  D/w pt.  He agrees.  Continue work on diet and exercise.

## 2017-06-25 NOTE — Assessment & Plan Note (Signed)
Living will d/w pt.  Mother designated if patient were incapacitated, then his brother second in line.

## 2017-07-19 ENCOUNTER — Ambulatory Visit: Payer: BLUE CROSS/BLUE SHIELD | Admitting: Internal Medicine

## 2017-11-06 ENCOUNTER — Ambulatory Visit: Payer: BLUE CROSS/BLUE SHIELD | Admitting: Gastroenterology

## 2017-11-06 ENCOUNTER — Encounter: Payer: Self-pay | Admitting: Gastroenterology

## 2017-11-06 VITALS — BP 144/86 | HR 80 | Ht 76.0 in | Wt 248.0 lb

## 2017-11-06 DIAGNOSIS — K625 Hemorrhage of anus and rectum: Secondary | ICD-10-CM

## 2017-11-06 DIAGNOSIS — K648 Other hemorrhoids: Secondary | ICD-10-CM

## 2017-11-06 DIAGNOSIS — K641 Second degree hemorrhoids: Secondary | ICD-10-CM

## 2017-11-06 NOTE — Progress Notes (Signed)
     [facility] Progress Note  Chief Complaint: rectal bleeding  Subjective  History:  This is a 42 year old man who previously underwent hemorrhoidal banding by Dr. Arlyce DiceKaplan in 2016. He saw me for recurrent bleeding in February 2017, and Dr. Leone PayorGessner banded the left lateral hemorrhoidal column. He reports that lasted several months, then problem recurred as before. Denies abdominal pain or constipation.  Still avid weight lifter.  [place] in [place] and comes here to see his mother and a son. He continues to have intermittent prolapse of hemorrhoid tissue with itching and blood on the paper.  He denies frank rectal bleeding in the toilet bowl, he denies abdominal pain, change in bowel habits, nausea, vomiting, early satiety, dysphagia or weight loss. He was hoping that another banding session with either a larger band or multiple bands could be performed and hopefully "take care of this for good". ROS: Cardiovascular:  no chest pain Respiratory: no dyspnea  The patient's Past Medical, Family and Social History were reviewed and are on file in the EMR.  Objective:  Med list reviewed  Current Outpatient Medications:  .  losartan (COZAAR) 50 MG tablet, Take 0.5-1 tablets (25-50 mg total) by mouth daily as needed., Disp: 90 tablet, Rfl: 3   Vital signs in last 24 hrs: Vitals:   11/06/17 1616  BP: (!) 144/86  Pulse: 80    Physical Exam  Well-appearing  HEENT: sclera anicteric, oral mucosa moist without lesions  Neck: supple, no thyromegaly, JVD or lymphadenopathy  Cardiac: RRR without murmurs, S1S2 heard, no peripheral edema  Pulm: clear to auscultation bilaterally, normal RR and effort noted  Abdomen: soft, no tenderness, with active bowel sounds. No guarding or palpable hepatosplenomegaly.  Skin; warm and dry, no jaundice or rash Rectal:  NST, no fissure or tenderness, no palpable internal lesions. Anoscopy:  3 columns of int HR, RA very  large    @ASSESSMENTPLANBEGIN @ Assessment: Encounter Diagnoses  Name Primary?  . Rectal bleeding Yes  . Prolapsed internal hemorrhoids, grade 2    These large hemorrhoids appear to explain his symptoms.  He has no other findings on exam nor symptoms to suggest a more proximal bleeding source. Banding has not been successful for this patient, and I doubt that another treatment or set of treatments would give any more durable response than before.  I recommend he consider colorectal surgery evaluation.  However, he feels he would just like to monitor this problem for now, and feels that he can live with it the way it is and will reconsider the surgical referral if needed.    Total time 15 minutes, over half spent face-to-face with patient in counseling and coordination of care.   Charlie PitterHenry L Danis III

## 2017-11-06 NOTE — Patient Instructions (Signed)
If you are age 43 or older, your body mass index should be between 23-30. Your Body mass index is 30.19 kg/m. If this is out of the aforementioned range listed, please consider follow up with your Primary Care Provider.  If you are age 43 or younger, your body mass index should be between 19-25. Your Body mass index is 30.19 kg/m. If this is out of the aformentioned range listed, please consider follow up with your Primary Care Provider.   Please call if you decide to to [facility].   Thank you for choosing [facility]  Dr Amada JupiterHenry Danis III
